# Patient Record
Sex: Male | Born: 1955 | Race: White | Hispanic: No | Smoking: Former smoker
Health system: Southern US, Community
[De-identification: ages and names within clinical notes are randomized; demographics above are authoritative.]

## PROBLEM LIST (undated history)

## (undated) DIAGNOSIS — J449 Chronic obstructive pulmonary disease, unspecified: Secondary | ICD-10-CM

## (undated) DIAGNOSIS — D696 Thrombocytopenia, unspecified: Secondary | ICD-10-CM

## (undated) DIAGNOSIS — K5190 Ulcerative colitis, unspecified, without complications: Secondary | ICD-10-CM

## (undated) DIAGNOSIS — M5136 Other intervertebral disc degeneration, lumbar region: Secondary | ICD-10-CM

## (undated) DIAGNOSIS — J45909 Unspecified asthma, uncomplicated: Secondary | ICD-10-CM

## (undated) DIAGNOSIS — E785 Hyperlipidemia, unspecified: Secondary | ICD-10-CM

## (undated) DIAGNOSIS — D649 Anemia, unspecified: Secondary | ICD-10-CM

## (undated) DIAGNOSIS — K589 Irritable bowel syndrome without diarrhea: Secondary | ICD-10-CM

## (undated) DIAGNOSIS — Z8739 Personal history of other diseases of the musculoskeletal system and connective tissue: Secondary | ICD-10-CM

## (undated) DIAGNOSIS — K7460 Unspecified cirrhosis of liver: Secondary | ICD-10-CM

## (undated) HISTORY — PX: HERNIA REPAIR: SHX51

## (undated) HISTORY — PX: COLONOSCOPY WITH ESOPHAGOGASTRODUODENOSCOPY (EGD): SHX5779

## (undated) HISTORY — PX: BACK SURGERY: SHX140

---

## 1999-12-04 ENCOUNTER — Encounter: Payer: Self-pay | Admitting: Orthopedic Surgery

## 1999-12-04 ENCOUNTER — Encounter: Payer: Self-pay | Admitting: Emergency Medicine

## 1999-12-04 ENCOUNTER — Emergency Department (HOSPITAL_COMMUNITY): Admission: EM | Admit: 1999-12-04 | Discharge: 1999-12-04 | Payer: Self-pay | Admitting: Emergency Medicine

## 2011-06-24 ENCOUNTER — Encounter: Payer: Self-pay | Admitting: Internal Medicine

## 2011-06-24 ENCOUNTER — Ambulatory Visit: Payer: Managed Care, Other (non HMO)

## 2011-06-24 ENCOUNTER — Ambulatory Visit (INDEPENDENT_AMBULATORY_CARE_PROVIDER_SITE_OTHER): Payer: Managed Care, Other (non HMO) | Admitting: Internal Medicine

## 2011-06-24 VITALS — BP 120/75 | HR 62 | Temp 98.9°F | Resp 14 | Ht 67.0 in | Wt 147.0 lb

## 2011-06-24 DIAGNOSIS — R9389 Abnormal findings on diagnostic imaging of other specified body structures: Secondary | ICD-10-CM

## 2011-06-24 DIAGNOSIS — R918 Other nonspecific abnormal finding of lung field: Secondary | ICD-10-CM

## 2011-06-24 DIAGNOSIS — R0602 Shortness of breath: Secondary | ICD-10-CM

## 2011-06-24 NOTE — Progress Notes (Signed)
  Subjective:    Patient ID: Clayton Robbins, male    DOB: Jun 09, 1955, 56 y.o.   MRN: [id number]  HPIPresents for followup chest x-ray due to an abnormality seen by Dr. Hal Hope 3 months ago. At that visit he was sent to GI for abdominal pain and eventually received the diagnosis of colitis. He is currently better with treatment. He is a former smoker having quit around 10 years ago. He has no chronic cough and has had no weight loss. He has no night sweats He does notice recent dyspnea on exertion over the past several months especially with walking up stairs. He works as a Veterinary surgeon but does not remember any industrial exposure to known toxins    Review of SystemsHistory of allergic rhinitis Shortness of breath gets worse with dust exposure Childhood history of asthma but no meds since then     Objective:   Physical Exam Thin male with normal vital signs No anterior cervical adenopathy The lungs are clear to auscultation though the breath sounds are quiet there no wheezes with forced expiration The heart has a regular rhythm without murmur rub or gallop      [facility] reading (PRIMARY) by  Dr.Audry Pecina:no nodules but ? Chronic changes of emphysema PFTs=severe obstruction Assessment & Plan:  Problem #1 abnormal chest x-ray-to be reviewed by radiology Problem #2 shortness of breath ?emphysema-consult pulmonary

## 2011-06-28 ENCOUNTER — Telehealth: Payer: Self-pay | Admitting: Internal Medicine

## 2011-06-30 NOTE — Telephone Encounter (Signed)
No note needed 

## 2011-07-10 ENCOUNTER — Institutional Professional Consult (permissible substitution): Payer: Self-pay | Admitting: Internal Medicine

## 2013-09-11 ENCOUNTER — Ambulatory Visit: Payer: Self-pay | Admitting: Surgery

## 2013-09-18 ENCOUNTER — Ambulatory Visit: Payer: Self-pay | Admitting: Surgery

## 2013-10-03 ENCOUNTER — Ambulatory Visit: Payer: Self-pay | Admitting: Gastroenterology

## 2013-12-29 ENCOUNTER — Ambulatory Visit: Payer: Self-pay | Admitting: Gastroenterology

## 2014-05-29 ENCOUNTER — Ambulatory Visit: Payer: Self-pay | Admitting: Gastroenterology

## 2014-08-22 NOTE — Op Note (Signed)
PATIENT NAME:  Clayton Robbins, Clayton Robbins MR#:  [id number] DATE OF BIRTH:  [birth year]  DATE OF PROCEDURE:  09/18/2013  PREOPERATIVE DIAGNOSIS: Right inguinal hernia.   POSTOPERATIVE DIAGNOSIS: Right inguinal hernia.   PROCEDURE: Right inguinal hernia repair.   SURGEON: Rochel Brome, M.D.   ANESTHESIA: General.   INDICATIONS: This 59 year old male has had bulging in the right groin and a hernia was demonstrated on physical exam and repair recommended for definitive treatment.   DESCRIPTION OF PROCEDURE: The patient was placed on the operating table in the supine position under general anesthesia. The right lower quadrant was clipped and prepared with ChloraPrep and draped in a sterile manner. A right lower quadrant transversely oriented suprapubic incision was made, carried down through subcutaneous tissues. Several small bleeding points were cauterized. Scarpa's fascia was incised. The external ring was identified. The external oblique aponeurosis was incised along the course of its fibers to open the external ring and expose the inguinal cord structures. The inguinal cord structures were mobilized and a Penrose drain passed around for traction. Cremaster fibers were spread examining the cord structures seeing no indirect component. There was however, a direct inguinal hernia, which the attenuated transversalis fascia was dissected and then incised circumferentially and removed. The sac itself was approximately 4 cm in length and was inverted. The repair was carried out with a row of 0 Surgilon sutures beginning at the pubic tubercle, suturing the conjoined tendon to the Cooper's ligament with transition stitch onto the shelving edge of the inguinal ligament incorporating transversalis fascia into the repair. The row of sutures was carried up to lead to satisfactory narrowing around the internal ring.   Next, an onlay Bard soft mesh was cut to create an oval shape of 2.8 x 3.5 cm with a notch cut out to  straddle the internal ring. This was placed over the repair. A medial relaxing incision was made so that the mesh would cover the relaxing incision. The mesh was sutured into place with 0 Surgilon. The repair looked good. Hemostasis was intact. The cut edges of the external oblique aponeurosis were closed with running 4-0 Vicryl. The deep fascia superior and lateral to the repair site was infiltrated with 0.5% Sensorcaine with epinephrine. Subcutaneous tissues were infiltrated using a total of 16 mL. Next, the Scarpa's fascia was closed with interrupted 4-0 Vicryl. The skin was closed with running 4-0 Monocryl subcuticular suture and Dermabond. The testicle remained in the scrotum. The patient tolerated surgery satisfactorily and was prepared for transfer to the recovery room.   ____________________________ Lenna Sciara. Rochel Brome, MD jws:aw D: 09/18/2013 08:38:49 ET T: 09/18/2013 08:54:19 ET JOB#: [id number]  cc: Loreli Dollar, MD, <Dictator> Loreli Dollar MD ELECTRONICALLY SIGNED 09/22/2013 9:11

## 2015-11-15 ENCOUNTER — Other Ambulatory Visit: Payer: Self-pay | Admitting: Internal Medicine

## 2015-11-15 DIAGNOSIS — K7460 Unspecified cirrhosis of liver: Secondary | ICD-10-CM

## 2015-12-03 ENCOUNTER — Ambulatory Visit
Admission: RE | Admit: 2015-12-03 | Discharge: 2015-12-03 | Disposition: A | Discharge status: Home / Self Care | Payer: 59 | Source: Ambulatory Visit | Attending: Internal Medicine | Admitting: Internal Medicine

## 2015-12-03 DIAGNOSIS — K7460 Unspecified cirrhosis of liver: Secondary | ICD-10-CM | POA: Diagnosis not present

## 2015-12-03 DIAGNOSIS — N281 Cyst of kidney, acquired: Secondary | ICD-10-CM | POA: Insufficient documentation

## 2015-12-03 DIAGNOSIS — R932 Abnormal findings on diagnostic imaging of liver and biliary tract: Secondary | ICD-10-CM | POA: Diagnosis not present

## 2016-04-06 ENCOUNTER — Encounter: Payer: Self-pay | Admitting: *Deleted

## 2016-04-07 ENCOUNTER — Encounter: Payer: Self-pay | Admitting: *Deleted

## 2016-04-07 ENCOUNTER — Ambulatory Visit: Payer: 59 | Admitting: Anesthesiology

## 2016-04-07 ENCOUNTER — Encounter
Admission: RE | Disposition: A | Discharge status: Home / Self Care | Payer: Self-pay | Source: Ambulatory Visit | Attending: Unknown Physician Specialty

## 2016-04-07 ENCOUNTER — Ambulatory Visit
Admission: RE | Admit: 2016-04-07 | Discharge: 2016-04-07 | Disposition: A | Discharge status: Home / Self Care | Payer: 59 | Source: Ambulatory Visit | Attending: Unknown Physician Specialty | Admitting: Unknown Physician Specialty

## 2016-04-07 DIAGNOSIS — K589 Irritable bowel syndrome without diarrhea: Secondary | ICD-10-CM | POA: Diagnosis not present

## 2016-04-07 DIAGNOSIS — D124 Benign neoplasm of descending colon: Secondary | ICD-10-CM | POA: Diagnosis not present

## 2016-04-07 DIAGNOSIS — K5730 Diverticulosis of large intestine without perforation or abscess without bleeding: Secondary | ICD-10-CM | POA: Diagnosis not present

## 2016-04-07 DIAGNOSIS — J449 Chronic obstructive pulmonary disease, unspecified: Secondary | ICD-10-CM | POA: Diagnosis not present

## 2016-04-07 DIAGNOSIS — Z86010 Personal history of colonic polyps: Secondary | ICD-10-CM | POA: Diagnosis not present

## 2016-04-07 DIAGNOSIS — K648 Other hemorrhoids: Secondary | ICD-10-CM | POA: Insufficient documentation

## 2016-04-07 DIAGNOSIS — Z79899 Other long term (current) drug therapy: Secondary | ICD-10-CM | POA: Diagnosis not present

## 2016-04-07 DIAGNOSIS — M5136 Other intervertebral disc degeneration, lumbar region: Secondary | ICD-10-CM | POA: Diagnosis not present

## 2016-04-07 DIAGNOSIS — E785 Hyperlipidemia, unspecified: Secondary | ICD-10-CM | POA: Diagnosis not present

## 2016-04-07 DIAGNOSIS — K5190 Ulcerative colitis, unspecified, without complications: Secondary | ICD-10-CM | POA: Diagnosis not present

## 2016-04-07 DIAGNOSIS — Z1211 Encounter for screening for malignant neoplasm of colon: Secondary | ICD-10-CM | POA: Insufficient documentation

## 2016-04-07 DIAGNOSIS — K7460 Unspecified cirrhosis of liver: Secondary | ICD-10-CM | POA: Diagnosis not present

## 2016-04-07 DIAGNOSIS — Z87891 Personal history of nicotine dependence: Secondary | ICD-10-CM | POA: Diagnosis not present

## 2016-04-07 DIAGNOSIS — D123 Benign neoplasm of transverse colon: Secondary | ICD-10-CM | POA: Diagnosis not present

## 2016-04-07 HISTORY — DX: Unspecified asthma, uncomplicated: J45.909

## 2016-04-07 HISTORY — DX: Other intervertebral disc degeneration, lumbar region: M51.36

## 2016-04-07 HISTORY — DX: Irritable bowel syndrome without diarrhea: K58.9

## 2016-04-07 HISTORY — DX: Ulcerative colitis, unspecified, without complications: K51.90

## 2016-04-07 HISTORY — DX: Unspecified cirrhosis of liver: K74.60

## 2016-04-07 HISTORY — PX: COLONOSCOPY WITH PROPOFOL: SHX5780

## 2016-04-07 HISTORY — DX: Personal history of other diseases of the musculoskeletal system and connective tissue: Z87.39

## 2016-04-07 HISTORY — DX: Anemia, unspecified: D64.9

## 2016-04-07 HISTORY — DX: Thrombocytopenia, unspecified: D69.6

## 2016-04-07 HISTORY — DX: Chronic obstructive pulmonary disease, unspecified: J44.9

## 2016-04-07 HISTORY — DX: Hyperlipidemia, unspecified: E78.5

## 2016-04-07 SURGERY — COLONOSCOPY WITH PROPOFOL
Anesthesia: General

## 2016-04-07 MED ORDER — FENTANYL CITRATE (PF) 100 MCG/2ML IJ SOLN
INTRAMUSCULAR | Status: DC | PRN
  Administered 2016-04-07: 50 ug via INTRAVENOUS

## 2016-04-07 MED ORDER — SODIUM CHLORIDE 0.9 % IV SOLN
INTRAVENOUS | Status: DC
  Administered 2016-04-07: 13:00:00 via INTRAVENOUS

## 2016-04-07 MED ORDER — SODIUM CHLORIDE 0.9 % IV SOLN: INTRAVENOUS | Status: DC

## 2016-04-07 MED ORDER — PROPOFOL 500 MG/50ML IV EMUL
INTRAVENOUS | Status: DC | PRN
  Administered 2016-04-07: 120 ug/kg/min via INTRAVENOUS

## 2016-04-07 MED ORDER — MIDAZOLAM HCL 2 MG/2ML IJ SOLN
INTRAMUSCULAR | Status: DC | PRN
  Administered 2016-04-07: 1 mg via INTRAVENOUS

## 2016-04-07 NOTE — Transfer of Care (Signed)
Immediate Anesthesia Transfer of Care Note  Patient: Clayton Robbins  Procedure(s) Performed: Procedure(s): COLONOSCOPY WITH PROPOFOL (N/A)  Patient Location: PACU  Anesthesia Type:General  Level of Consciousness: awake, alert , oriented and sedated  Airway & Oxygen Therapy: Patient Spontanous Breathing and Patient connected to nasal cannula oxygen  Post-op Assessment: Report given to RN and Post -op Vital signs reviewed and stable  Post vital signs: Reviewed and stable  Last Vitals:  Vitals:   04/07/16 1100  BP: (!) 154/97  Pulse: 72  Resp: 18  Temp: (!) 35.3 C    Last Pain:  Vitals:   04/07/16 1100  TempSrc: Tympanic         Complications: No apparent anesthesia complications

## 2016-04-07 NOTE — H&P (Signed)
Primary Care Physician:  Idelle Crouch, MD Primary Gastroenterologist:  Dr. Vira Agar  Pre-Procedure History & Physical: HPI:  Clayton Robbins is a 60 y.o. male is here for an colonoscopy.   Past Medical History:  Diagnosis Date  . Anemia   . Asthma   . Cirrhosis ([place])   . COPD (chronic obstructive pulmonary disease) ([place])   . DDD (degenerative disc disease), lumbar   . History of rhabdomyolysis   . Hyperlipidemia   . IBS (irritable bowel syndrome)   . Thrombocytopenia ([place])   . Ulcerative colitis [facility])     Past Surgical History:  Procedure Laterality Date  . COLONOSCOPY WITH ESOPHAGOGASTRODUODENOSCOPY (EGD)    . HERNIA REPAIR      Prior to Admission medications   Medication Sig Start Date End Date Taking? Authorizing Provider  albuterol (PROVENTIL HFA;VENTOLIN HFA) 108 (90 Base) MCG/ACT inhaler Inhale 2 puffs into the lungs every 4 (four) hours as needed for wheezing or shortness of breath.   Yes Historical Provider, MD  B Complex-C (B-COMPLEX WITH VITAMIN C) tablet Take 1 tablet by mouth daily.   Yes Historical Provider, MD  clonazePAM (KLONOPIN) 0.5 MG tablet Take 0.5 mg by mouth 2 (two) times daily as needed for anxiety.   Yes Historical Provider, MD  folic acid (FOLVITE) 1 MG tablet Take 1 mg by mouth daily.   Yes Historical Provider, MD  mesalamine (LIALDA) 1.2 G EC tablet Take 1,200 mg by mouth daily with breakfast.   Yes Historical Provider, MD  methocarbamol (ROBAXIN) 750 MG tablet Take 750 mg by mouth 4 (four) times daily.   Yes Historical Provider, MD  Multiple Vitamin (MULTIVITAMIN) tablet Take 1 tablet by mouth daily.   Yes Historical Provider, MD  umeclidinium-vilanterol (ANORO ELLIPTA) 62.5-25 MCG/INH AEPB Inhale 1 puff into the lungs daily.   Yes Historical Provider, MD  HYDROcodone-acetaminophen (NORCO/VICODIN) 5-325 MG tablet Take 1 tablet by mouth every 6 (six) hours as needed for moderate pain.    Historical Provider, MD  mesalamine (CANASA) 1000 MG  suppository Place 1,000 mg rectally at bedtime.    Historical Provider, MD    Allergies as of 03/17/2016  . (No Known Allergies)    History reviewed. No pertinent family history.  Social History   Social History  . Marital status: Divorced    Spouse name: N/A  . Number of children: N/A  . Years of education: N/A   Occupational History  . Not on file.   Social History Main Topics  . Smoking status: Former Smoker    Packs/day: 1.50    Years: 20.00    Quit date: 06/23/1996  . Smokeless tobacco: Current User  . Alcohol use 5.4 oz/week    6 Cans of beer, 3 Shots of liquor per week  . Drug use: No  . Sexual activity: Not on file   Other Topics Concern  . Not on file   Social History Narrative  . No narrative on file    Review of Systems: See HPI, otherwise negative ROS  Physical Exam: BP (!) 154/97   Pulse 72   Temp (!) 95.6 F (35.3 C) (Tympanic)   Resp 18  General:   Alert,  pleasant and cooperative in NAD Head:  Normocephalic and atraumatic. Neck:  Supple; no masses or thyromegaly. Lungs:  Clear throughout to auscultation.    Heart:  Regular rate and rhythm. Abdomen:  Soft, nontender and nondistended. Normal bowel sounds, without guarding, and without rebound.   Neurologic:  Alert  and  oriented x4;  grossly normal neurologically.  Impression/Plan: Clayton Robbins is here for an colonoscopy to be performed for [facility] colon polyps, left sided ulcerative colitis on Lialda 4.8g per day.  Risks, benefits, limitations, and alternatives regarding  colonoscopy have been reviewed with the patient.  Questions have been answered.  All parties agreeable.   Gaylyn Cheers, MD  04/07/2016, 1:21 PM

## 2016-04-07 NOTE — Anesthesia Postprocedure Evaluation (Signed)
Anesthesia Post Note  Patient: Clayton Robbins  Procedure(s) Performed: Procedure(s) (LRB): COLONOSCOPY WITH PROPOFOL (N/A)  Patient location during evaluation: Endoscopy Anesthesia Type: General Level of consciousness: awake and alert and oriented Pain management: pain level controlled Vital Signs Assessment: post-procedure vital signs reviewed and stable Respiratory status: spontaneous breathing, nonlabored ventilation and respiratory function stable Cardiovascular status: blood pressure returned to baseline and stable Postop Assessment: no signs of nausea or vomiting Anesthetic complications: no    Last Vitals:  Vitals:   04/07/16 1402 04/07/16 1412  BP: 133/86 (!) 144/95  Pulse: 78 83  Resp:  14  Temp: 36.6 C     Last Pain:  Vitals:   04/07/16 1402  TempSrc: Tympanic                 Eivin Mascio

## 2016-04-07 NOTE — Anesthesia Procedure Notes (Signed)
Performed by: COOK-MARTIN, Abena Erdman Pre-anesthesia Checklist: Patient identified, Emergency Drugs available, Suction available, Patient being monitored and Timeout performed Patient Re-evaluated:Patient Re-evaluated prior to inductionOxygen Delivery Method: Nasal cannula Preoxygenation: Pre-oxygenation with 100% oxygen Intubation Type: IV induction Placement Confirmation: positive ETCO2 and CO2 detector       

## 2016-04-07 NOTE — Anesthesia Preprocedure Evaluation (Signed)
Anesthesia Evaluation  Patient identified by MRN, date of birth, ID band Patient awake    Reviewed: Allergy & Precautions, NPO status , Patient's Chart, lab work & pertinent test results  History of Anesthesia Complications Negative for: history of anesthetic complications  Airway Mallampati: II  TM Distance: >3 FB Neck ROM: Full    Dental  (+) Poor Dentition   Pulmonary neg sleep apnea, COPD,  COPD inhaler, former smoker,    breath sounds clear to auscultation- rhonchi (-) wheezing      Cardiovascular Exercise Tolerance: Good (-) hypertension(-) CAD and (-) Past MI  Rhythm:Regular Rate:Normal - Systolic murmurs and - Diastolic murmurs    Neuro/Psych negative neurological ROS  negative psych ROS   GI/Hepatic PUD, Fatty liver    Endo/Other  negative endocrine ROSneg diabetes  Renal/GU negative Renal ROS     Musculoskeletal  (+) Arthritis ,   Abdominal (+) - obese,   Peds  Hematology  (+) anemia ,   Anesthesia Other Findings Past Medical History: No date: Anemia No date: Asthma No date: Cirrhosis ([facility]) No date: COPD (chronic obstructive pulmonary disease) (* No date: DDD (degenerative disc disease), lumbar No date: History of rhabdomyolysis No date: Hyperlipidemia No date: IBS (irritable bowel syndrome) No date: Thrombocytopenia ([place]) No date: Ulcerative colitis ([place])   Reproductive/Obstetrics                            Anesthesia Physical Anesthesia Plan  ASA: III  Anesthesia Plan: General   Post-op Pain Management:    Induction: Intravenous  Airway Management Planned: Natural Airway  Additional Equipment:   Intra-op Plan:   Post-operative Plan:   Informed Consent: I have reviewed the patients History and Physical, chart, labs and discussed the procedure including the risks, benefits and alternatives for the proposed anesthesia with the patient or authorized  representative who has indicated his/her understanding and acceptance.   Dental advisory given  Plan Discussed with: CRNA and Anesthesiologist  Anesthesia Plan Comments:         Anesthesia Quick Evaluation

## 2016-04-07 NOTE — Op Note (Signed)
[facility] Gastroenterology Patient Name: Clayton Robbins Procedure Date: 04/07/2016 1:31 PM MRN: [id number] Account #: [id number] Date of Birth: Jan 26, 1956 Admit Type: Outpatient Age: 60 Room: [facility] ENDO ROOM 4 Gender: Male Note Status: Finalized Procedure:            Colonoscopy Indications:          High risk colon cancer surveillance: Personal history                        of colonic polyps Providers:            Manya Silvas, MD Referring MD:         Leonie Douglas. Doy Hutching, MD (Referring MD) Medicines:            Propofol per Anesthesia Complications:        No immediate complications. Procedure:            Pre-Anesthesia Assessment:                       - After reviewing the risks and benefits, the patient                        was deemed in satisfactory condition to undergo the                        procedure.                       After obtaining informed consent, the colonoscope was                        passed under direct vision. Throughout the procedure,                        the patient's blood pressure, pulse, and oxygen                        saturations were monitored continuously. The                        Colonoscope was introduced through the anus and                        advanced to the the cecum, identified by appendiceal                        orifice and ileocecal valve. The colonoscopy was                        performed without difficulty. The patient tolerated the                        procedure well. The quality of the bowel preparation                        was good. Findings:      The sigmoid colon looked very good with no obvious inflammation present,       vascular markings normal in some areas.      A diminutive polyp was found in the transverse colon. The polyp was       sessile. The  polyp was removed with a jumbo cold forceps. Resection and       retrieval were complete.      A diminutive polyp was found in the  descending colon. The polyp was       sessile. The polyp was removed with a jumbo cold forceps. Resection and       retrieval were complete.      Multiple small-mouthed diverticula were found in the sigmoid colon and       descending colon.      Internal hemorrhoids were found during endoscopy. The hemorrhoids were       small and Grade I (internal hemorrhoids that do not prolapse).      The exam was otherwise without abnormality. Impression:           - One diminutive polyp in the transverse colon, removed                        with a jumbo cold forceps. Resected and retrieved.                       - One diminutive polyp in the descending colon, removed                        with a jumbo cold forceps. Resected and retrieved.                       - Diverticulosis in the sigmoid colon and in the                        descending colon.                       - Internal hemorrhoids.                       - The examination was otherwise normal. Recommendation:       - Await pathology results. Stay on medications Manya Silvas, MD 04/07/2016 2:01:46 PM This report has been signed electronically. Number of Addenda: 0 Note Initiated On: 04/07/2016 1:31 PM Scope Withdrawal Time: 0 hours 10 minutes 5 seconds  Total Procedure Duration: 0 hours 20 minutes 24 seconds       [facility]

## 2016-04-10 ENCOUNTER — Encounter: Payer: Self-pay | Admitting: Unknown Physician Specialty

## 2016-04-10 LAB — SURGICAL PATHOLOGY

## 2016-05-26 DIAGNOSIS — K5150 Left sided colitis without complications: Secondary | ICD-10-CM | POA: Diagnosis not present

## 2016-05-26 DIAGNOSIS — E782 Mixed hyperlipidemia: Secondary | ICD-10-CM | POA: Diagnosis not present

## 2016-05-26 DIAGNOSIS — Z79899 Other long term (current) drug therapy: Secondary | ICD-10-CM | POA: Diagnosis not present

## 2016-05-26 DIAGNOSIS — J439 Emphysema, unspecified: Secondary | ICD-10-CM | POA: Diagnosis not present

## 2016-06-09 DIAGNOSIS — R0609 Other forms of dyspnea: Secondary | ICD-10-CM | POA: Diagnosis not present

## 2016-09-08 DIAGNOSIS — R0602 Shortness of breath: Secondary | ICD-10-CM | POA: Diagnosis not present

## 2016-09-08 DIAGNOSIS — R05 Cough: Secondary | ICD-10-CM | POA: Diagnosis not present

## 2016-09-08 DIAGNOSIS — J439 Emphysema, unspecified: Secondary | ICD-10-CM | POA: Diagnosis not present

## 2016-10-15 DIAGNOSIS — M545 Low back pain: Secondary | ICD-10-CM | POA: Diagnosis not present

## 2016-11-27 DIAGNOSIS — J310 Chronic rhinitis: Secondary | ICD-10-CM | POA: Diagnosis not present

## 2016-11-27 DIAGNOSIS — J449 Chronic obstructive pulmonary disease, unspecified: Secondary | ICD-10-CM | POA: Diagnosis not present

## 2016-11-27 DIAGNOSIS — R0609 Other forms of dyspnea: Secondary | ICD-10-CM | POA: Diagnosis not present

## 2016-12-13 DIAGNOSIS — R0609 Other forms of dyspnea: Secondary | ICD-10-CM | POA: Diagnosis not present

## 2016-12-13 DIAGNOSIS — J439 Emphysema, unspecified: Secondary | ICD-10-CM | POA: Diagnosis not present

## 2016-12-15 ENCOUNTER — Other Ambulatory Visit: Payer: Self-pay | Admitting: Internal Medicine

## 2016-12-15 DIAGNOSIS — Z0000 Encounter for general adult medical examination without abnormal findings: Secondary | ICD-10-CM | POA: Diagnosis not present

## 2016-12-15 DIAGNOSIS — J439 Emphysema, unspecified: Secondary | ICD-10-CM | POA: Diagnosis not present

## 2016-12-15 DIAGNOSIS — R0600 Dyspnea, unspecified: Secondary | ICD-10-CM

## 2016-12-15 DIAGNOSIS — R0609 Other forms of dyspnea: Principal | ICD-10-CM

## 2016-12-20 DIAGNOSIS — Z79899 Other long term (current) drug therapy: Secondary | ICD-10-CM | POA: Diagnosis not present

## 2016-12-22 ENCOUNTER — Ambulatory Visit
Admission: RE | Admit: 2016-12-22 | Discharge: 2016-12-22 | Disposition: A | Discharge status: Home / Self Care | Payer: 59 | Source: Ambulatory Visit | Attending: Internal Medicine | Admitting: Internal Medicine

## 2016-12-22 DIAGNOSIS — R0609 Other forms of dyspnea: Secondary | ICD-10-CM | POA: Diagnosis present

## 2016-12-22 DIAGNOSIS — I700 Atherosclerosis of aorta: Secondary | ICD-10-CM | POA: Diagnosis not present

## 2016-12-22 DIAGNOSIS — R0600 Dyspnea, unspecified: Secondary | ICD-10-CM | POA: Diagnosis not present

## 2016-12-22 DIAGNOSIS — J439 Emphysema, unspecified: Secondary | ICD-10-CM | POA: Insufficient documentation

## 2016-12-22 MED ORDER — IOPAMIDOL (ISOVUE-370) INJECTION 76%
75.0000 mL | Freq: Once | INTRAVENOUS | Status: AC | PRN
  Administered 2016-12-22: 75 mL via INTRAVENOUS

## 2017-02-09 DIAGNOSIS — J449 Chronic obstructive pulmonary disease, unspecified: Secondary | ICD-10-CM | POA: Diagnosis not present

## 2017-02-09 DIAGNOSIS — R0609 Other forms of dyspnea: Secondary | ICD-10-CM | POA: Diagnosis not present

## 2017-03-20 DIAGNOSIS — J439 Emphysema, unspecified: Secondary | ICD-10-CM | POA: Diagnosis not present

## 2017-03-30 DIAGNOSIS — E781 Pure hyperglyceridemia: Secondary | ICD-10-CM | POA: Diagnosis not present

## 2017-03-30 DIAGNOSIS — E039 Hypothyroidism, unspecified: Secondary | ICD-10-CM | POA: Diagnosis not present

## 2017-06-18 DIAGNOSIS — E039 Hypothyroidism, unspecified: Secondary | ICD-10-CM | POA: Diagnosis not present

## 2017-06-18 DIAGNOSIS — J439 Emphysema, unspecified: Secondary | ICD-10-CM | POA: Diagnosis not present

## 2017-06-18 DIAGNOSIS — D696 Thrombocytopenia, unspecified: Secondary | ICD-10-CM | POA: Diagnosis not present

## 2017-06-26 DIAGNOSIS — R0609 Other forms of dyspnea: Secondary | ICD-10-CM | POA: Diagnosis not present

## 2017-06-26 DIAGNOSIS — J439 Emphysema, unspecified: Secondary | ICD-10-CM | POA: Diagnosis not present

## 2017-07-03 DIAGNOSIS — Z79899 Other long term (current) drug therapy: Secondary | ICD-10-CM | POA: Diagnosis not present

## 2017-07-03 DIAGNOSIS — E782 Mixed hyperlipidemia: Secondary | ICD-10-CM | POA: Diagnosis not present

## 2017-10-03 DIAGNOSIS — Z79899 Other long term (current) drug therapy: Secondary | ICD-10-CM | POA: Diagnosis not present

## 2017-10-03 DIAGNOSIS — E782 Mixed hyperlipidemia: Secondary | ICD-10-CM | POA: Diagnosis not present

## 2017-12-25 IMAGING — CT CT ANGIO CHEST
2 of 8 series · 17 of 32 positions shown · IV contrast (isovue)
Comparison: Chest x-ray dated 06/24/2011 and report of chest x-ray
dated 09/08/2016

CLINICAL DATA: Dyspnea on exertion.

EXAM:
CT ANGIOGRAPHY CHEST WITH CONTRAST
TECHNIQUE: Multidetector CT imaging of the chest was performed using the
standard protocol during bolus administration of intravenous
contrast. Multiplanar CT image reconstructions and MIPs were
obtained to evaluate the vascular anatomy.
CONTRAST:  150 cc Isovue 370 in two boluses

[Series 10: thins · axial · 0.74mm/px · z∈[-670,-412]mm · 9 of 324 slices shown (1 of 2)]
[im 33/324  lung]
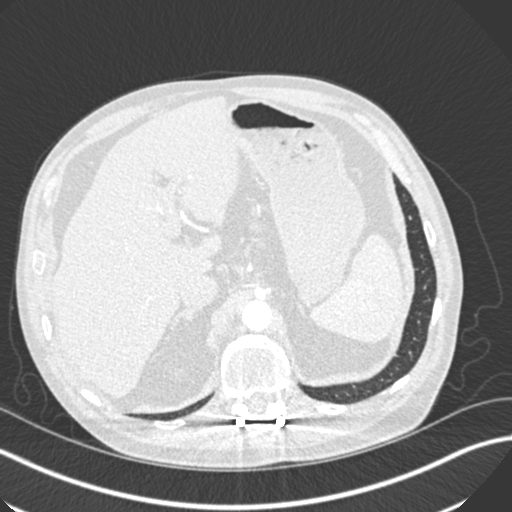
[im 65/324  soft-tissue]
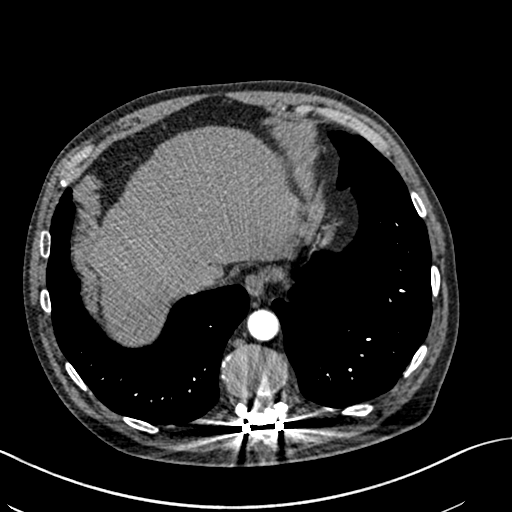
[im 97/324  lung]
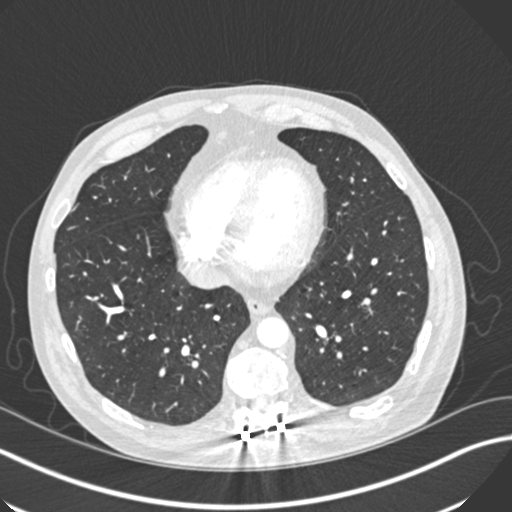
[im 130/324  soft-tissue]
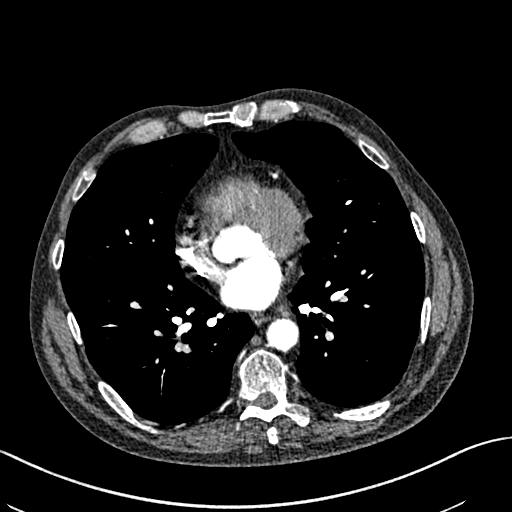
[im 162/324  lung]
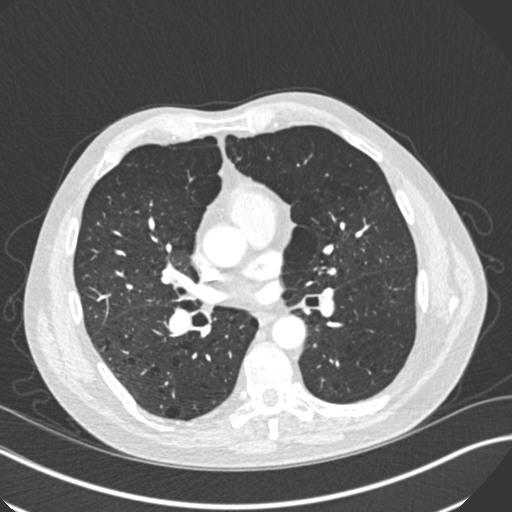
[im 194/324  soft-tissue]
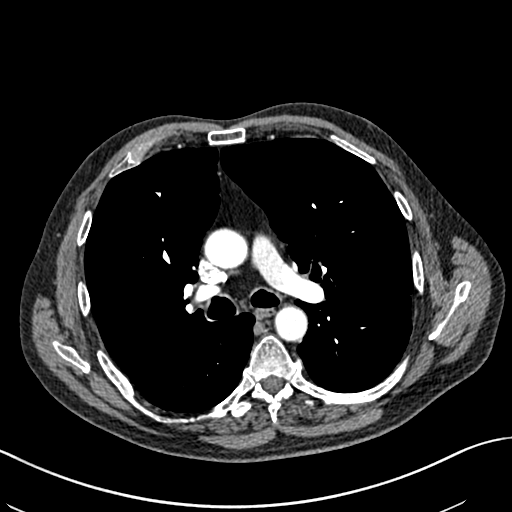
[im 227/324  lung]
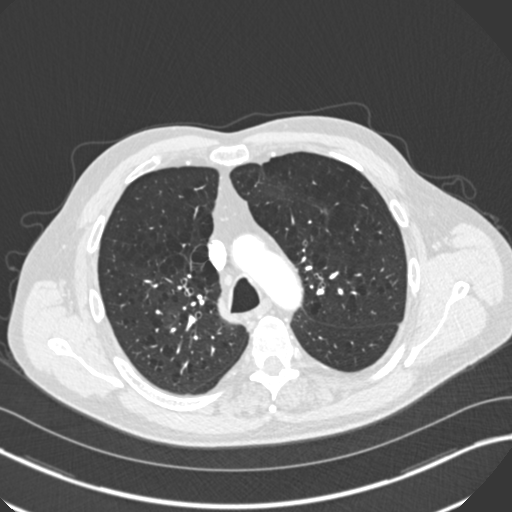
[im 259/324  soft-tissue]
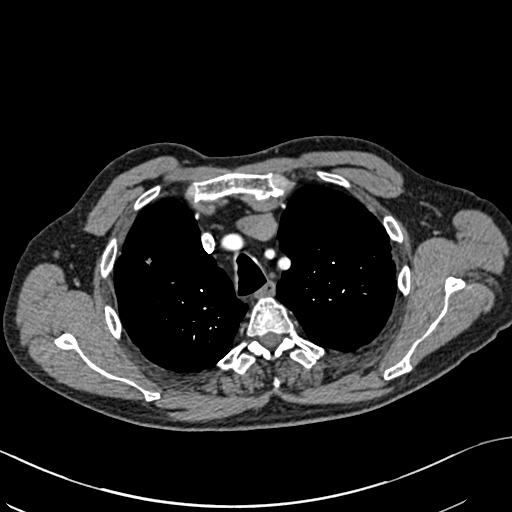
[im 291/324  lung]
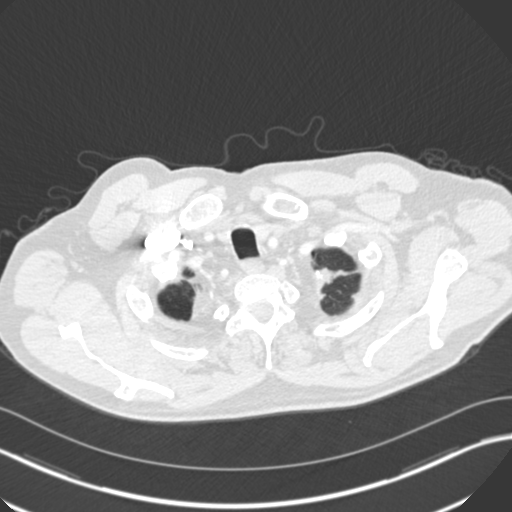

[Series 20: thins · axial · 0.75mm/px · z∈[-670,-412]mm · 8 of 324 slices shown (2 of 2)]
[im 33/324  lung]
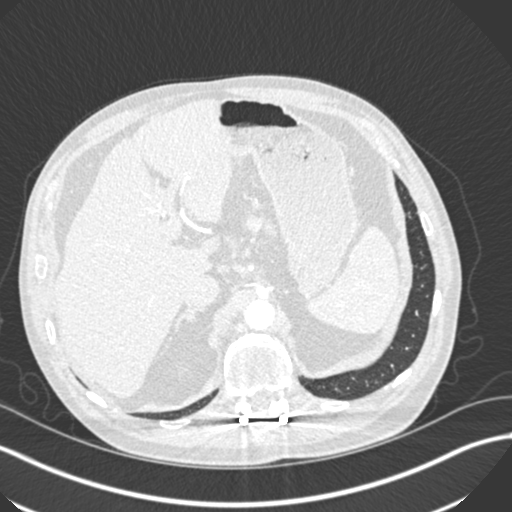
[im 65/324  lung]
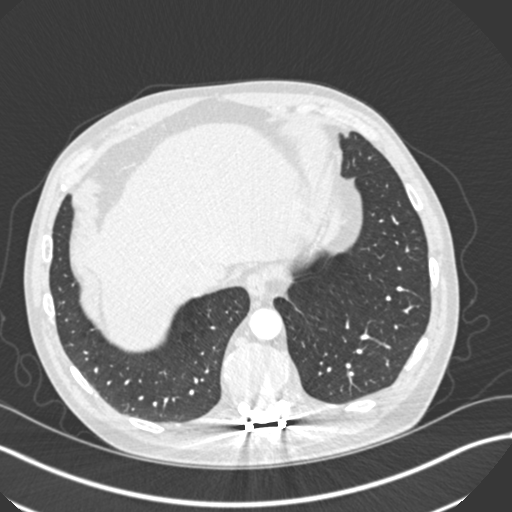
[im 97/324  lung]
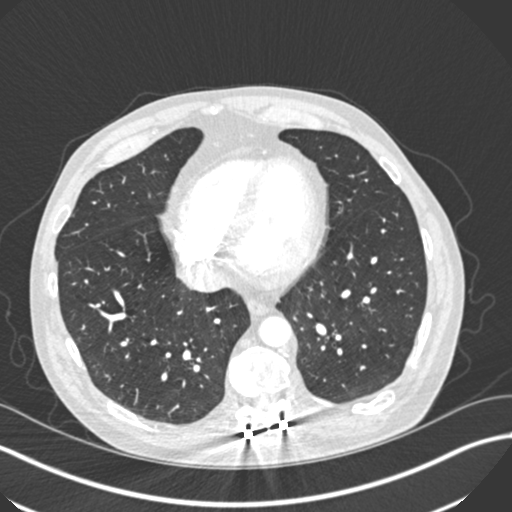
[im 130/324  lung]
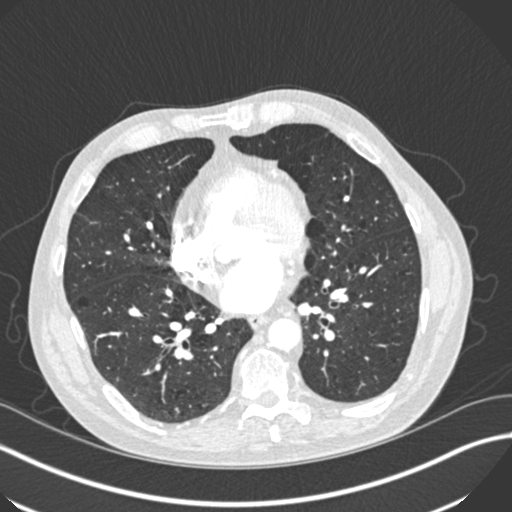
[im 194/324  lung]
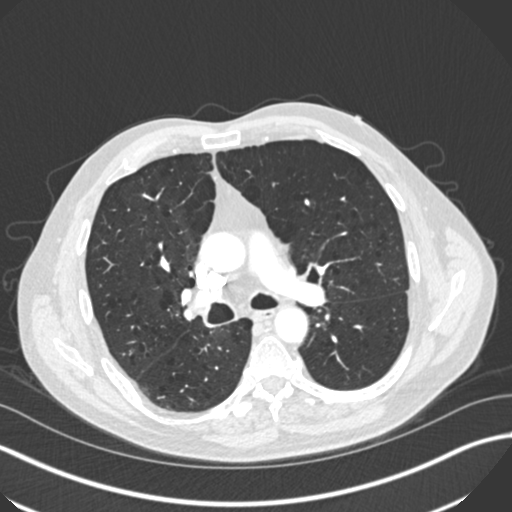
[im 227/324  lung]
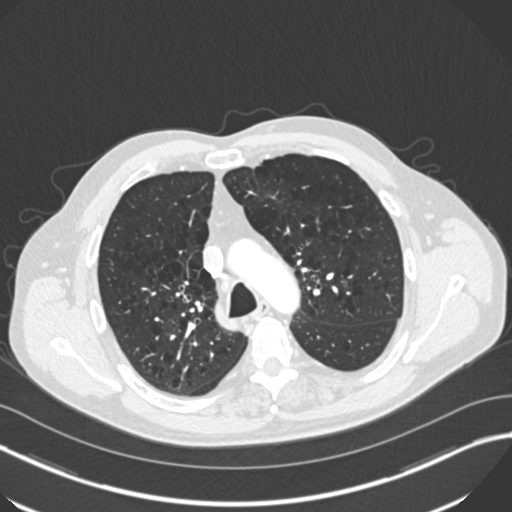
[im 259/324  lung]
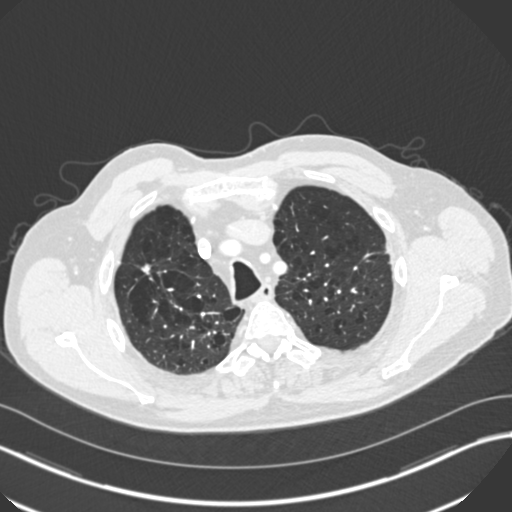
[im 291/324  lung]
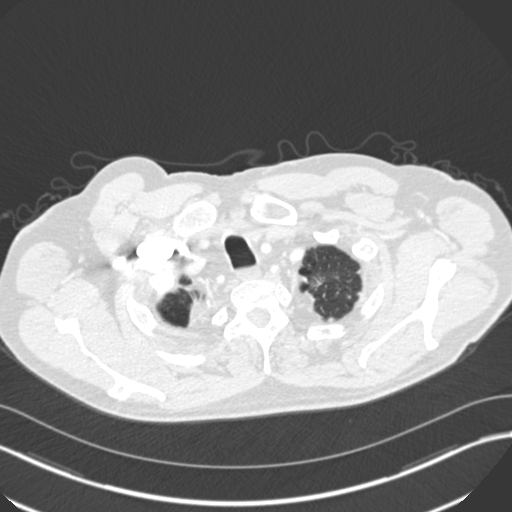

[17 of 32 positions shown; findings below may reference images not displayed]

FINDINGS: Cardiovascular: Satisfactory opacification of the pulmonary arteries
to the segmental level. No evidence of pulmonary embolism. Normal
heart size. No pericardial effusion. Slight aortic atherosclerosis.

Mediastinum/Nodes: No enlarged mediastinal, hilar, or axillary lymph
nodes. Thyroid gland, trachea, and esophagus demonstrate no
significant findings.

Lungs/Pleura: No infiltrates or effusions. Extensive emphysematous
changes particularly in the upper lobes were there is also apical
scarring bilaterally.

Upper Abdomen: Negative.

Musculoskeletal: No acute abnormality. Old severe compression
fracture of L1. Posterior fusion at that level.

Review of the MIP images confirms the above findings.
IMPRESSION: 1. No pulmonary emboli or other acute abnormality.
2. Severe emphysema.
3. Aortic atherosclerosis.

## 2018-01-04 DIAGNOSIS — Z79899 Other long term (current) drug therapy: Secondary | ICD-10-CM | POA: Diagnosis not present

## 2018-01-04 DIAGNOSIS — J439 Emphysema, unspecified: Secondary | ICD-10-CM | POA: Diagnosis not present

## 2018-01-04 DIAGNOSIS — Z0000 Encounter for general adult medical examination without abnormal findings: Secondary | ICD-10-CM | POA: Diagnosis not present

## 2018-01-08 DIAGNOSIS — R0609 Other forms of dyspnea: Secondary | ICD-10-CM | POA: Diagnosis not present

## 2018-01-08 DIAGNOSIS — R490 Dysphonia: Secondary | ICD-10-CM | POA: Diagnosis not present

## 2018-01-08 DIAGNOSIS — J449 Chronic obstructive pulmonary disease, unspecified: Secondary | ICD-10-CM | POA: Diagnosis not present

## 2018-05-14 DIAGNOSIS — R0609 Other forms of dyspnea: Secondary | ICD-10-CM | POA: Diagnosis not present

## 2018-05-14 DIAGNOSIS — J439 Emphysema, unspecified: Secondary | ICD-10-CM | POA: Diagnosis not present

## 2018-07-05 DIAGNOSIS — J449 Chronic obstructive pulmonary disease, unspecified: Secondary | ICD-10-CM | POA: Diagnosis not present

## 2018-07-05 DIAGNOSIS — E782 Mixed hyperlipidemia: Secondary | ICD-10-CM | POA: Diagnosis not present

## 2018-07-05 DIAGNOSIS — J439 Emphysema, unspecified: Secondary | ICD-10-CM | POA: Diagnosis not present

## 2018-07-05 DIAGNOSIS — E039 Hypothyroidism, unspecified: Secondary | ICD-10-CM | POA: Diagnosis not present

## 2018-07-05 DIAGNOSIS — Z79899 Other long term (current) drug therapy: Secondary | ICD-10-CM | POA: Diagnosis not present

## 2018-09-19 DIAGNOSIS — R0600 Dyspnea, unspecified: Secondary | ICD-10-CM | POA: Diagnosis not present

## 2018-09-19 DIAGNOSIS — J449 Chronic obstructive pulmonary disease, unspecified: Secondary | ICD-10-CM | POA: Diagnosis not present

## 2019-07-24 DIAGNOSIS — Z20822 Contact with and (suspected) exposure to covid-19: Secondary | ICD-10-CM | POA: Diagnosis not present

## 2019-07-24 DIAGNOSIS — R0602 Shortness of breath: Secondary | ICD-10-CM | POA: Diagnosis not present

## 2019-07-24 DIAGNOSIS — J439 Emphysema, unspecified: Secondary | ICD-10-CM | POA: Diagnosis not present

## 2019-07-24 DIAGNOSIS — Z01818 Encounter for other preprocedural examination: Secondary | ICD-10-CM | POA: Diagnosis not present

## 2019-07-24 DIAGNOSIS — R0600 Dyspnea, unspecified: Secondary | ICD-10-CM | POA: Diagnosis not present

## 2019-08-20 DIAGNOSIS — Z79899 Other long term (current) drug therapy: Secondary | ICD-10-CM | POA: Diagnosis not present

## 2019-08-20 DIAGNOSIS — E782 Mixed hyperlipidemia: Secondary | ICD-10-CM | POA: Diagnosis not present

## 2019-08-20 DIAGNOSIS — K7030 Alcoholic cirrhosis of liver without ascites: Secondary | ICD-10-CM | POA: Diagnosis not present

## 2019-08-20 DIAGNOSIS — E039 Hypothyroidism, unspecified: Secondary | ICD-10-CM | POA: Diagnosis not present

## 2019-08-20 DIAGNOSIS — J439 Emphysema, unspecified: Secondary | ICD-10-CM | POA: Diagnosis not present

## 2019-08-20 DIAGNOSIS — D696 Thrombocytopenia, unspecified: Secondary | ICD-10-CM | POA: Diagnosis not present

## 2019-08-20 DIAGNOSIS — Z0000 Encounter for general adult medical examination without abnormal findings: Secondary | ICD-10-CM | POA: Diagnosis not present

## 2019-08-20 DIAGNOSIS — G894 Chronic pain syndrome: Secondary | ICD-10-CM | POA: Diagnosis not present

## 2020-01-15 DIAGNOSIS — R0600 Dyspnea, unspecified: Secondary | ICD-10-CM | POA: Diagnosis not present

## 2020-01-15 DIAGNOSIS — J439 Emphysema, unspecified: Secondary | ICD-10-CM | POA: Diagnosis not present

## 2020-01-15 DIAGNOSIS — Z23 Encounter for immunization: Secondary | ICD-10-CM | POA: Diagnosis not present

## 2020-01-15 DIAGNOSIS — Z09 Encounter for follow-up examination after completed treatment for conditions other than malignant neoplasm: Secondary | ICD-10-CM | POA: Diagnosis not present

## 2020-03-12 DIAGNOSIS — R7309 Other abnormal glucose: Secondary | ICD-10-CM | POA: Diagnosis not present

## 2020-03-12 DIAGNOSIS — J439 Emphysema, unspecified: Secondary | ICD-10-CM | POA: Diagnosis not present

## 2020-03-12 DIAGNOSIS — Z125 Encounter for screening for malignant neoplasm of prostate: Secondary | ICD-10-CM | POA: Diagnosis not present

## 2020-03-12 DIAGNOSIS — G894 Chronic pain syndrome: Secondary | ICD-10-CM | POA: Diagnosis not present

## 2020-03-12 DIAGNOSIS — Z0000 Encounter for general adult medical examination without abnormal findings: Secondary | ICD-10-CM | POA: Diagnosis not present

## 2020-03-12 DIAGNOSIS — Z79899 Other long term (current) drug therapy: Secondary | ICD-10-CM | POA: Diagnosis not present

## 2020-03-12 DIAGNOSIS — D696 Thrombocytopenia, unspecified: Secondary | ICD-10-CM | POA: Diagnosis not present

## 2020-03-12 DIAGNOSIS — K7030 Alcoholic cirrhosis of liver without ascites: Secondary | ICD-10-CM | POA: Diagnosis not present

## 2020-03-12 DIAGNOSIS — E039 Hypothyroidism, unspecified: Secondary | ICD-10-CM | POA: Diagnosis not present

## 2020-03-12 DIAGNOSIS — K5150 Left sided colitis without complications: Secondary | ICD-10-CM | POA: Diagnosis not present

## 2020-03-12 DIAGNOSIS — E782 Mixed hyperlipidemia: Secondary | ICD-10-CM | POA: Diagnosis not present

## 2020-09-10 DIAGNOSIS — E039 Hypothyroidism, unspecified: Secondary | ICD-10-CM | POA: Diagnosis not present

## 2020-09-10 DIAGNOSIS — Z79899 Other long term (current) drug therapy: Secondary | ICD-10-CM | POA: Diagnosis not present

## 2020-09-10 DIAGNOSIS — Z0000 Encounter for general adult medical examination without abnormal findings: Secondary | ICD-10-CM | POA: Diagnosis not present

## 2020-09-10 DIAGNOSIS — I10 Essential (primary) hypertension: Secondary | ICD-10-CM | POA: Diagnosis not present

## 2020-09-10 DIAGNOSIS — G894 Chronic pain syndrome: Secondary | ICD-10-CM | POA: Diagnosis not present

## 2020-09-10 DIAGNOSIS — J431 Panlobular emphysema: Secondary | ICD-10-CM | POA: Diagnosis not present

## 2020-09-10 DIAGNOSIS — R7309 Other abnormal glucose: Secondary | ICD-10-CM | POA: Diagnosis not present

## 2020-09-10 DIAGNOSIS — J449 Chronic obstructive pulmonary disease, unspecified: Secondary | ICD-10-CM | POA: Diagnosis not present

## 2020-09-10 DIAGNOSIS — E782 Mixed hyperlipidemia: Secondary | ICD-10-CM | POA: Diagnosis not present

## 2020-11-02 DIAGNOSIS — D696 Thrombocytopenia, unspecified: Secondary | ICD-10-CM | POA: Diagnosis not present

## 2020-11-02 DIAGNOSIS — E782 Mixed hyperlipidemia: Secondary | ICD-10-CM | POA: Diagnosis not present

## 2020-11-02 DIAGNOSIS — G894 Chronic pain syndrome: Secondary | ICD-10-CM | POA: Diagnosis not present

## 2020-11-02 DIAGNOSIS — J431 Panlobular emphysema: Secondary | ICD-10-CM | POA: Diagnosis not present

## 2020-11-02 DIAGNOSIS — I10 Essential (primary) hypertension: Secondary | ICD-10-CM | POA: Diagnosis not present

## 2020-11-02 DIAGNOSIS — K5150 Left sided colitis without complications: Secondary | ICD-10-CM | POA: Diagnosis not present

## 2020-11-02 DIAGNOSIS — E039 Hypothyroidism, unspecified: Secondary | ICD-10-CM | POA: Diagnosis not present

## 2021-01-04 DIAGNOSIS — R0609 Other forms of dyspnea: Secondary | ICD-10-CM | POA: Diagnosis not present

## 2021-01-04 DIAGNOSIS — F1021 Alcohol dependence, in remission: Secondary | ICD-10-CM | POA: Diagnosis not present

## 2021-01-04 DIAGNOSIS — K7030 Alcoholic cirrhosis of liver without ascites: Secondary | ICD-10-CM | POA: Diagnosis not present

## 2021-01-04 DIAGNOSIS — J431 Panlobular emphysema: Secondary | ICD-10-CM | POA: Diagnosis not present

## 2021-01-04 DIAGNOSIS — K766 Portal hypertension: Secondary | ICD-10-CM | POA: Diagnosis not present

## 2021-01-04 DIAGNOSIS — Z862 Personal history of diseases of the blood and blood-forming organs and certain disorders involving the immune mechanism: Secondary | ICD-10-CM | POA: Diagnosis not present

## 2021-01-04 DIAGNOSIS — K5150 Left sided colitis without complications: Secondary | ICD-10-CM | POA: Diagnosis not present

## 2021-01-04 DIAGNOSIS — Z86010 Personal history of colonic polyps: Secondary | ICD-10-CM | POA: Diagnosis not present

## 2021-02-25 DIAGNOSIS — G894 Chronic pain syndrome: Secondary | ICD-10-CM | POA: Diagnosis not present

## 2021-02-25 DIAGNOSIS — J431 Panlobular emphysema: Secondary | ICD-10-CM | POA: Diagnosis not present

## 2021-02-25 DIAGNOSIS — E782 Mixed hyperlipidemia: Secondary | ICD-10-CM | POA: Diagnosis not present

## 2021-02-25 DIAGNOSIS — Z125 Encounter for screening for malignant neoplasm of prostate: Secondary | ICD-10-CM | POA: Diagnosis not present

## 2021-02-25 DIAGNOSIS — K5150 Left sided colitis without complications: Secondary | ICD-10-CM | POA: Diagnosis not present

## 2021-02-25 DIAGNOSIS — I10 Essential (primary) hypertension: Secondary | ICD-10-CM | POA: Diagnosis not present

## 2021-02-25 DIAGNOSIS — Z79899 Other long term (current) drug therapy: Secondary | ICD-10-CM | POA: Diagnosis not present

## 2021-02-25 DIAGNOSIS — E039 Hypothyroidism, unspecified: Secondary | ICD-10-CM | POA: Diagnosis not present

## 2021-02-25 DIAGNOSIS — Z23 Encounter for immunization: Secondary | ICD-10-CM | POA: Diagnosis not present

## 2021-02-25 DIAGNOSIS — F1021 Alcohol dependence, in remission: Secondary | ICD-10-CM | POA: Diagnosis not present

## 2021-02-25 DIAGNOSIS — D696 Thrombocytopenia, unspecified: Secondary | ICD-10-CM | POA: Diagnosis not present

## 2021-03-29 DIAGNOSIS — Z125 Encounter for screening for malignant neoplasm of prostate: Secondary | ICD-10-CM | POA: Diagnosis not present

## 2021-03-29 DIAGNOSIS — E782 Mixed hyperlipidemia: Secondary | ICD-10-CM | POA: Diagnosis not present

## 2021-03-29 DIAGNOSIS — Z79899 Other long term (current) drug therapy: Secondary | ICD-10-CM | POA: Diagnosis not present

## 2021-03-29 DIAGNOSIS — E039 Hypothyroidism, unspecified: Secondary | ICD-10-CM | POA: Diagnosis not present

## 2021-03-29 DIAGNOSIS — I10 Essential (primary) hypertension: Secondary | ICD-10-CM | POA: Diagnosis not present

## 2021-04-12 ENCOUNTER — Encounter: Payer: Self-pay | Admitting: Internal Medicine

## 2021-04-13 ENCOUNTER — Encounter
Admission: RE | Disposition: A | Discharge status: Home / Self Care | Payer: Self-pay | Source: Home / Self Care | Attending: Internal Medicine

## 2021-04-13 ENCOUNTER — Other Ambulatory Visit: Payer: Self-pay

## 2021-04-13 ENCOUNTER — Ambulatory Visit: Payer: PPO | Admitting: Anesthesiology

## 2021-04-13 ENCOUNTER — Ambulatory Visit
Admission: RE | Admit: 2021-04-13 | Discharge: 2021-04-13 | Disposition: A | Discharge status: Home / Self Care | Payer: PPO | Attending: Internal Medicine | Admitting: Internal Medicine

## 2021-04-13 ENCOUNTER — Encounter: Payer: Self-pay | Admitting: Internal Medicine

## 2021-04-13 DIAGNOSIS — K5150 Left sided colitis without complications: Secondary | ICD-10-CM | POA: Diagnosis not present

## 2021-04-13 DIAGNOSIS — Z1211 Encounter for screening for malignant neoplasm of colon: Secondary | ICD-10-CM | POA: Diagnosis not present

## 2021-04-13 DIAGNOSIS — K766 Portal hypertension: Secondary | ICD-10-CM | POA: Diagnosis not present

## 2021-04-13 DIAGNOSIS — K760 Fatty (change of) liver, not elsewhere classified: Secondary | ICD-10-CM | POA: Diagnosis not present

## 2021-04-13 DIAGNOSIS — D132 Benign neoplasm of duodenum: Secondary | ICD-10-CM | POA: Insufficient documentation

## 2021-04-13 DIAGNOSIS — M1990 Unspecified osteoarthritis, unspecified site: Secondary | ICD-10-CM | POA: Insufficient documentation

## 2021-04-13 DIAGNOSIS — Z8719 Personal history of other diseases of the digestive system: Secondary | ICD-10-CM | POA: Insufficient documentation

## 2021-04-13 DIAGNOSIS — K5730 Diverticulosis of large intestine without perforation or abscess without bleeding: Secondary | ICD-10-CM | POA: Insufficient documentation

## 2021-04-13 DIAGNOSIS — D696 Thrombocytopenia, unspecified: Secondary | ICD-10-CM | POA: Diagnosis not present

## 2021-04-13 DIAGNOSIS — J449 Chronic obstructive pulmonary disease, unspecified: Secondary | ICD-10-CM | POA: Diagnosis not present

## 2021-04-13 DIAGNOSIS — K640 First degree hemorrhoids: Secondary | ICD-10-CM | POA: Diagnosis not present

## 2021-04-13 DIAGNOSIS — Z86010 Personal history of colonic polyps: Secondary | ICD-10-CM | POA: Diagnosis not present

## 2021-04-13 DIAGNOSIS — Z8711 Personal history of peptic ulcer disease: Secondary | ICD-10-CM | POA: Diagnosis not present

## 2021-04-13 DIAGNOSIS — D649 Anemia, unspecified: Secondary | ICD-10-CM | POA: Diagnosis not present

## 2021-04-13 HISTORY — PX: COLONOSCOPY WITH PROPOFOL: SHX5780

## 2021-04-13 HISTORY — PX: ESOPHAGOGASTRODUODENOSCOPY: SHX5428

## 2021-04-13 SURGERY — COLONOSCOPY WITH PROPOFOL
Anesthesia: General

## 2021-04-13 MED ORDER — PROPOFOL 10 MG/ML IV BOLUS
INTRAVENOUS | Status: DC | PRN
  Administered 2021-04-13: 40 mg via INTRAVENOUS
  Administered 2021-04-13: 60 mg via INTRAVENOUS

## 2021-04-13 MED ORDER — PROPOFOL 500 MG/50ML IV EMUL
INTRAVENOUS | Status: AC
  Filled 2021-04-13: qty 50

## 2021-04-13 MED ORDER — PROPOFOL 500 MG/50ML IV EMUL
INTRAVENOUS | Status: DC | PRN
  Administered 2021-04-13: 175 ug/kg/min via INTRAVENOUS

## 2021-04-13 MED ORDER — PHENYLEPHRINE 40 MCG/ML (10ML) SYRINGE FOR IV PUSH (FOR BLOOD PRESSURE SUPPORT)
PREFILLED_SYRINGE | INTRAVENOUS | Status: DC | PRN
  Administered 2021-04-13: 80 ug via INTRAVENOUS

## 2021-04-13 MED ORDER — SODIUM CHLORIDE 0.9 % IV SOLN: INTRAVENOUS | Status: DC

## 2021-04-13 NOTE — Op Note (Signed)
[facility] Gastroenterology Patient Name: Clayton Robbins Procedure Date: 04/13/2021 10:12 AM MRN: [id number] Account #: [id number] Date of Birth: 08/19/1955 Admit Type: Outpatient Age: 65 Room: [facility] ENDO ROOM 2 Gender: Male Note Status: Finalized Instrument Name: Upper Endoscope [id number] Procedure:             Upper GI endoscopy Indications:           CIrrhosis of the liver, Portal venous hypertension Providers:             Benay Pike. Nikeshia Keetch MD, MD Medicines:             Propofol per Anesthesia Complications:         No immediate complications. Procedure:             Pre-Anesthesia Assessment:                        - The risks and benefits of the procedure and the                         sedation options and risks were discussed with the                         patient. All questions were answered and informed                         consent was obtained.                        - Patient identification and proposed procedure were                         verified prior to the procedure by the physician and                         the nurse. The procedure was verified in the procedure                         room.                        - ASA Grade Assessment: III - A patient with severe                         systemic disease.                        - After reviewing the risks and benefits, the patient                         was deemed in satisfactory condition to undergo the                         procedure.                        After obtaining informed consent, the endoscope was                         passed under direct vision. Throughout the procedure,  the patient's blood pressure, pulse, and oxygen                         saturations were monitored continuously. The Endoscope                         was introduced through the mouth, and advanced to the                         third part of duodenum. The upper GI endoscopy was                          accomplished without difficulty. The patient tolerated                         the procedure well. Findings:      The esophagus was normal.      The stomach was normal.      A single 10 mm sessile polyp with no bleeding was found in the second       portion of the duodenum. The polyp was removed with a cold snare.       Resection and retrieval were complete.      The first portion of the duodenum and third portion of the duodenum were       normal.      The exam was otherwise without abnormality.      There is no endoscopic evidence of varices in the entire esophagus.      There is no endoscopic evidence of varices in the entire examined       stomach. Impression:            - Normal esophagus.                        - Normal stomach.                        - A single duodenal polyp. Resected and retrieved.                        - Normal first portion of the duodenum and third                         portion of the duodenum.                        - The examination was otherwise normal. Recommendation:        - Await pathology results.                        - Repeat EGD for variceal screening/surveillance in 2                         years.                        - Proceed with colonoscopy Procedure Code(s):     --- Professional ---                        [phone], Esophagogastroduodenoscopy, flexible,  transoral; with removal of tumor(s), polyp(s), or                         other lesion(s) by snare technique Diagnosis Code(s):     --- Professional ---                        K31.7, Polyp of stomach and duodenum CPT copyright 2019 American Medical Association. All rights reserved. The codes documented in this report are preliminary and upon coder review may  be revised to meet current compliance requirements. Efrain Sella MD, MD 04/13/2021 10:43:23 AM This report has been signed electronically. Number of Addenda: 0 Note Initiated  On: 04/13/2021 10:12 AM Estimated Blood Loss:  Estimated blood loss: none.      [facility]

## 2021-04-13 NOTE — Anesthesia Postprocedure Evaluation (Signed)
Anesthesia Post Note  Patient: Clayton Robbins  Procedure(s) Performed: COLONOSCOPY WITH PROPOFOL ESOPHAGOGASTRODUODENOSCOPY (EGD)  Patient location during evaluation: Endoscopy Anesthesia Type: General Level of consciousness: awake and alert Pain management: pain level controlled Vital Signs Assessment: post-procedure vital signs reviewed and stable Respiratory status: spontaneous breathing, nonlabored ventilation, respiratory function stable and patient connected to nasal cannula oxygen Cardiovascular status: blood pressure returned to baseline and stable Postop Assessment: no apparent nausea or vomiting Anesthetic complications: no   No notable events documented.   Last Vitals:  Vitals:   04/13/21 1121 04/13/21 1131  BP: 129/78 138/84  Pulse:    Resp:    Temp:    SpO2:      Last Pain:  Vitals:   04/13/21 1131  TempSrc:   PainSc: 0-No pain                 Arita Miss

## 2021-04-13 NOTE — Transfer of Care (Signed)
Immediate Anesthesia Transfer of Care Note  Patient: Clayton Robbins  Procedure(s) Performed: COLONOSCOPY WITH PROPOFOL ESOPHAGOGASTRODUODENOSCOPY (EGD)  Patient Location: PACU  Anesthesia Type:General  Level of Consciousness: awake and alert   Airway & Oxygen Therapy: Patient Spontanous Breathing and Patient connected to nasal cannula oxygen  Post-op Assessment: Report given to RN and Post -op Vital signs reviewed and stable  Post vital signs: Reviewed and stable  Last Vitals:  Vitals Value Taken Time  BP 101/56 04/13/21 1101  Temp    Pulse 64 04/13/21 1101  Resp 17 04/13/21 1101  SpO2 95 % 04/13/21 1101  Vitals shown include unvalidated device data.  Last Pain:  Vitals:   04/13/21 0925  TempSrc: Temporal  PainSc: 0-No pain         Complications: No notable events documented.

## 2021-04-13 NOTE — Anesthesia Preprocedure Evaluation (Signed)
Anesthesia Evaluation  Patient identified by MRN, date of birth, ID band Patient awake    Reviewed: Allergy & Precautions, NPO status , Patient's Chart, lab work & pertinent test results  History of Anesthesia Complications Negative for: history of anesthetic complications  Airway Mallampati: II  TM Distance: >3 FB Neck ROM: Full    Dental  (+) Edentulous Upper, Edentulous Lower   Pulmonary neg sleep apnea, COPD,  COPD inhaler, Patient abstained from smoking.Not current smoker, former smoker,    breath sounds clear to auscultation- rhonchi (-) wheezing      Cardiovascular Exercise Tolerance: Good (-) hypertension(-) CAD and (-) Past MI  Rhythm:Regular Rate:Normal - Systolic murmurs and - Diastolic murmurs Normal echo 2018   Neuro/Psych negative neurological ROS  negative psych ROS   GI/Hepatic PUD, Fatty liver    Endo/Other  negative endocrine ROSneg diabetes  Renal/GU negative Renal ROS     Musculoskeletal  (+) Arthritis ,   Abdominal (+) - obese,   Peds  Hematology  (+) anemia ,   Anesthesia Other Findings Past Medical History: No date: Anemia No date: Asthma No date: Cirrhosis ([place]) No date: COPD (chronic obstructive pulmonary disease) (* No date: DDD (degenerative disc disease), lumbar No date: History of rhabdomyolysis No date: Hyperlipidemia No date: IBS (irritable bowel syndrome) No date: Thrombocytopenia ([place]) No date: Ulcerative colitis ([place])   Reproductive/Obstetrics                             Anesthesia Physical  Anesthesia Plan  ASA: 3  Anesthesia Plan: General   Post-op Pain Management: Minimal or no pain anticipated   Induction: Intravenous  PONV Risk Score and Plan: 2 and Propofol infusion, TIVA and Ondansetron  Airway Management Planned: Natural Airway  Additional Equipment: None  Intra-op Plan:   Post-operative Plan:   Informed Consent: I have  reviewed the patients History and Physical, chart, labs and discussed the procedure including the risks, benefits and alternatives for the proposed anesthesia with the patient or authorized representative who has indicated his/her understanding and acceptance.     Dental advisory given  Plan Discussed with: CRNA and Anesthesiologist  Anesthesia Plan Comments: (Discussed risks of anesthesia with patient, including possibility of difficulty with spontaneous ventilation under anesthesia necessitating airway intervention, PONV, and rare risks such as cardiac or respiratory or neurological events, and allergic reactions. Discussed the role of CRNA in patient's perioperative care. Patient understands.)        Anesthesia Quick Evaluation

## 2021-04-13 NOTE — Op Note (Signed)
[facility] Patient Name: Clayton Robbins Procedure Date: 04/13/2021 10:12 AM MRN: [id number] Account #: [id number] Date of Birth: 1955/05/21 Admit Type: Outpatient Age: 65 Room: [facility] ENDO ROOM 2 Gender: Male Note Status: Finalized Instrument Name: Jasper Riling [id number] Procedure:             Colonoscopy Indications:           High risk colon cancer surveillance: Ulcerative left                         sided colitis of 8 (or more) years duration Providers:             Benay Pike. Shira Bobst MD, MD Medicines:             Propofol per Anesthesia Complications:         No immediate complications. Procedure:             Pre-Anesthesia Assessment:                        - The risks and benefits of the procedure and the                         sedation options and risks were discussed with the                         patient. All questions were answered and informed                         consent was obtained.                        - Patient identification and proposed procedure were                         verified prior to the procedure by the nurse. The                         procedure was verified in the procedure room.                        - After reviewing the risks and benefits, the patient                         was deemed in satisfactory condition to undergo the                         procedure.                        - ASA Grade Assessment: III - A patient with severe                         systemic disease.                        After obtaining informed consent, the colonoscope was                         passed under direct vision. Throughout the procedure,  the patient's blood pressure, pulse, and oxygen                         saturations were monitored continuously. The                         Colonoscope was introduced through the anus and                         advanced to the the cecum, identified by  appendiceal                         orifice and ileocecal valve. The colonoscopy was                         performed without difficulty. The patient tolerated                         the procedure well. The quality of the bowel                         preparation was good. The ileocecal valve, appendiceal                         orifice, and rectum were photographed. Findings:      The perianal and digital rectal examinations were normal. Pertinent       negatives include normal sphincter tone and no palpable rectal lesions.      Non-bleeding internal hemorrhoids were found during retroflexion. The       hemorrhoids were Grade I (internal hemorrhoids that do not prolapse).      Many small-mouthed diverticula were found in the sigmoid colon. There       was no evidence of diverticular bleeding.      Normal mucosa was found in the entire colon. Four biopsies were obtained       with cold forceps for ulcerative colitis surveillance in the right       colon, as well as four biopsies in the transverse colon, four biopsies       in the left colon and two biopsies in the rectum.      No other significant abnormalities were identified in a careful       examination of the remainder of the colon. Impression:            - Non-bleeding internal hemorrhoids.                        - Mild diverticulosis in the sigmoid colon. There was                         no evidence of diverticular bleeding.                        - Normal mucosa in the entire examined colon.                        - Biopsies performed in the right colon, in the                         transverse colon,  in the left colon and in the rectum. Recommendation:        - Await pathology results from EGD, also performed                         today.                        - Patient has a contact number available for                         emergencies. The signs and symptoms of potential                         delayed complications  were discussed with the patient.                         Return to normal activities tomorrow. Written                         discharge instructions were provided to the patient.                        - Resume previous diet.                        - Continue present medications.                        - Await pathology results.                        - Repeat colonoscopy in 3 years for surveillance based                         on pathology results.                        - Return to my office in 1 year.                        - The findings and recommendations were discussed with                         the patient. Procedure Code(s):     --- Professional ---                        [id number], Colonoscopy, flexible; with biopsy, single or                         multiple Diagnosis Code(s):     --- Professional ---                        K57.30, Diverticulosis of large intestine without                         perforation or abscess without bleeding                        K64.0, First degree hemorrhoids  K51.50, Left sided colitis without complications CPT copyright 2019 [employer]. All rights reserved. The codes documented in this report are preliminary and upon coder review may  be revised to meet current compliance requirements. Efrain Sella MD, MD 04/13/2021 11:01:48 AM This report has been signed electronically. Number of Addenda: 0 Note Initiated On: 04/13/2021 10:12 AM Scope Withdrawal Time: 0 hours 10 minutes 58 seconds  Total Procedure Duration: 0 hours 13 minutes 20 seconds  Estimated Blood Loss:  Estimated blood loss: none.      [facility]

## 2021-04-13 NOTE — H&P (Signed)
Outpatient short stay form Pre-procedure 04/13/2021 10:13 AM Dontrel Smethers K. Alice Reichert, M.D.  Primary Physician: Fulton Reek, M.D.  Reason for visit:  Left sided ulcerative colitis  History of present illness:  Mr. Clayton Robbins is a pleasant 65 year old male presenting for new patient visit for the above problems. Patient has a reported history of left-sided ulcerative colitis diagnosed in November 2012 by Dr. Veverly Fells in [place]. He was placed on Lialda and has responded favorably with no significant flares in the last 10 years. He claims 1-2 bowel movements that are formed per day without blood or urgency. He denies any upper symptoms such as hematemesis, nausea or vomiting or increased abdominal girth. He has had a reported history of cirrhosis on ultrasound performed in 2015 when admitted for an acute vertebral fracture at [facility]. Subsequent ultrasounds on my review in 2017 revealed only fatty liver. Patient also has a history of thrombocytopenia which is consistent with cirrhosis but not diagnostic. Thrombocytopenia has improved with patient abstaining from alcohol.  Last endoscopy: 10/03/13 Rayann Heman) - normal; repeat in 2018 recommended Last colonoscopy: 03/16/11 (Dr. Benson Norway) - severe L-sided colitis, medium hemorrhoids, polyps (TA), diverticula (path: chronic active colitis); repeat in 2017 recommended. Last check in May 2022 revealed a normal platelet count of 244,000. CHEM 23 also revealed a normal albumin of 4.2 and serum creatinine 0.8. All electrolytes were also normal.  Patient has a history of severe COPD but does not take home oxygen. He does take regular inhaler therapy and has some mild dyspnea on exertion. He denies any chest pain, palpitations, orthopnea or significant peripheral edema.  Patient stopped drinking alcohol in July 2021 and has been abstinent since that time.  04/07/16 - Colonoscopy - RTE - Two TA polyps, diverticulosis.     Current  Facility-Administered Medications:    0.9 %  sodium chloride infusion, , Intravenous, Continuous, [place], Benay Pike, MD, Last Rate: 20 mL/hr at 04/13/21 0954, New Bag at 04/13/21 0954  Medications Prior to Admission  Medication Sig Dispense Refill Last Dose   albuterol (PROVENTIL HFA;VENTOLIN HFA) 108 (90 Base) MCG/ACT inhaler Inhale 2 puffs into the lungs every 4 (four) hours as needed for wheezing or shortness of breath.   Past Week   atorvastatin (LIPITOR) 10 MG tablet Take 10 mg by mouth daily.   04/12/2021   B Complex-C (B-COMPLEX WITH VITAMIN C) tablet Take 1 tablet by mouth daily.   Past Week   clonazePAM (KLONOPIN) 0.5 MG tablet Take 0.5 mg by mouth 2 (two) times daily as needed for anxiety.   Past Week   folic acid (FOLVITE) 1 MG tablet Take 1 mg by mouth daily.   04/12/2021   ibuprofen (ADVIL) 200 MG tablet Take 200 mg by mouth every 6 (six) hours as needed.   Past Week   levothyroxine (SYNTHROID) 50 MCG tablet Take 50 mcg by mouth daily before breakfast.   04/12/2021   losartan-hydrochlorothiazide (HYZAAR) 100-12.5 MG tablet Take 1 tablet by mouth daily.   04/12/2021   mesalamine (LIALDA) 1.2 G EC tablet Take 1,200 mg by mouth daily with breakfast.   04/12/2021   Multiple Vitamin (MULTIVITAMIN) tablet Take 1 tablet by mouth daily.   Past Week   propranolol (INDERAL) 10 MG tablet Take 10 mg by mouth daily.   04/12/2021   traZODone (DESYREL) 100 MG tablet Take 100 mg by mouth at bedtime.   Past Week   umeclidinium-vilanterol (ANORO ELLIPTA) 62.5-25 MCG/INH AEPB Inhale 1 puff into the lungs daily.  04/13/2021   HYDROcodone-acetaminophen (NORCO/VICODIN) 5-325 MG tablet Take 1 tablet by mouth every 6 (six) hours as needed for moderate pain.      mesalamine (CANASA) 1000 MG suppository Place 1,000 mg rectally at bedtime.      methocarbamol (ROBAXIN) 750 MG tablet Take 750 mg by mouth 4 (four) times daily.        No Known Allergies   Past Medical History:  Diagnosis Date   Anemia     Asthma    Cirrhosis (HCC)    COPD (chronic obstructive pulmonary disease) (HCC)    DDD (degenerative disc disease), lumbar    History of rhabdomyolysis    Hyperlipidemia    IBS (irritable bowel syndrome)    Thrombocytopenia ([facility])    Ulcerative colitis ([place])     Review of systems:  Otherwise negative.    Physical Exam  Gen: Alert, oriented. Appears stated age.  HEENT: [place]/AT. PERRLA. Lungs: CTA, no wheezes. CV: RR nl S1, S2. Abd: soft, benign, no masses. BS+ Ext: No edema. Pulses 2+    Planned procedures: Proceed with EGD and colonoscopy. The patient understands the nature of the planned procedure, indications, risks, alternatives and potential complications including but not limited to bleeding, infection, perforation, damage to internal organs and possible oversedation/side effects from anesthesia. The patient agrees and gives consent to proceed.  Please refer to procedure notes for findings, recommendations and patient disposition/instructions.     Byran Bilotti K. Alice Reichert, M.D. Gastroenterology 04/13/2021  10:13 AM

## 2021-04-14 ENCOUNTER — Encounter: Payer: Self-pay | Admitting: Internal Medicine

## 2021-04-14 LAB — SURGICAL PATHOLOGY

## 2021-04-28 DIAGNOSIS — R7989 Other specified abnormal findings of blood chemistry: Secondary | ICD-10-CM | POA: Diagnosis not present

## 2021-06-29 DIAGNOSIS — F1021 Alcohol dependence, in remission: Secondary | ICD-10-CM | POA: Diagnosis not present

## 2021-06-29 DIAGNOSIS — Z125 Encounter for screening for malignant neoplasm of prostate: Secondary | ICD-10-CM | POA: Diagnosis not present

## 2021-06-29 DIAGNOSIS — E782 Mixed hyperlipidemia: Secondary | ICD-10-CM | POA: Diagnosis not present

## 2021-06-29 DIAGNOSIS — I10 Essential (primary) hypertension: Secondary | ICD-10-CM | POA: Diagnosis not present

## 2021-06-29 DIAGNOSIS — D696 Thrombocytopenia, unspecified: Secondary | ICD-10-CM | POA: Diagnosis not present

## 2021-06-29 DIAGNOSIS — Z79899 Other long term (current) drug therapy: Secondary | ICD-10-CM | POA: Diagnosis not present

## 2021-06-29 DIAGNOSIS — J431 Panlobular emphysema: Secondary | ICD-10-CM | POA: Diagnosis not present

## 2021-06-29 DIAGNOSIS — K766 Portal hypertension: Secondary | ICD-10-CM | POA: Diagnosis not present

## 2021-06-29 DIAGNOSIS — Z0000 Encounter for general adult medical examination without abnormal findings: Secondary | ICD-10-CM | POA: Diagnosis not present

## 2021-06-29 DIAGNOSIS — E039 Hypothyroidism, unspecified: Secondary | ICD-10-CM | POA: Diagnosis not present

## 2021-06-29 DIAGNOSIS — G894 Chronic pain syndrome: Secondary | ICD-10-CM | POA: Diagnosis not present

## 2021-06-29 DIAGNOSIS — K7030 Alcoholic cirrhosis of liver without ascites: Secondary | ICD-10-CM | POA: Diagnosis not present

## 2021-07-18 DIAGNOSIS — J449 Chronic obstructive pulmonary disease, unspecified: Secondary | ICD-10-CM | POA: Diagnosis not present

## 2021-07-18 DIAGNOSIS — R0609 Other forms of dyspnea: Secondary | ICD-10-CM | POA: Diagnosis not present

## 2021-08-30 DIAGNOSIS — J449 Chronic obstructive pulmonary disease, unspecified: Secondary | ICD-10-CM | POA: Diagnosis not present

## 2021-09-28 DIAGNOSIS — Z125 Encounter for screening for malignant neoplasm of prostate: Secondary | ICD-10-CM | POA: Diagnosis not present

## 2021-09-28 DIAGNOSIS — E782 Mixed hyperlipidemia: Secondary | ICD-10-CM | POA: Diagnosis not present

## 2021-09-28 DIAGNOSIS — I10 Essential (primary) hypertension: Secondary | ICD-10-CM | POA: Diagnosis not present

## 2021-09-28 DIAGNOSIS — E039 Hypothyroidism, unspecified: Secondary | ICD-10-CM | POA: Diagnosis not present

## 2021-09-28 DIAGNOSIS — Z79899 Other long term (current) drug therapy: Secondary | ICD-10-CM | POA: Diagnosis not present

## 2021-09-29 DIAGNOSIS — D696 Thrombocytopenia, unspecified: Secondary | ICD-10-CM | POA: Diagnosis not present

## 2021-09-29 DIAGNOSIS — I10 Essential (primary) hypertension: Secondary | ICD-10-CM | POA: Diagnosis not present

## 2021-09-29 DIAGNOSIS — E039 Hypothyroidism, unspecified: Secondary | ICD-10-CM | POA: Diagnosis not present

## 2021-09-29 DIAGNOSIS — G894 Chronic pain syndrome: Secondary | ICD-10-CM | POA: Diagnosis not present

## 2021-09-29 DIAGNOSIS — Z79899 Other long term (current) drug therapy: Secondary | ICD-10-CM | POA: Diagnosis not present

## 2021-09-29 DIAGNOSIS — E782 Mixed hyperlipidemia: Secondary | ICD-10-CM | POA: Diagnosis not present

## 2021-09-29 DIAGNOSIS — F1021 Alcohol dependence, in remission: Secondary | ICD-10-CM | POA: Diagnosis not present

## 2021-09-29 DIAGNOSIS — J431 Panlobular emphysema: Secondary | ICD-10-CM | POA: Diagnosis not present

## 2021-09-30 DIAGNOSIS — J449 Chronic obstructive pulmonary disease, unspecified: Secondary | ICD-10-CM | POA: Diagnosis not present

## 2021-10-18 DIAGNOSIS — J449 Chronic obstructive pulmonary disease, unspecified: Secondary | ICD-10-CM | POA: Diagnosis not present

## 2021-10-18 DIAGNOSIS — I952 Hypotension due to drugs: Secondary | ICD-10-CM | POA: Diagnosis not present

## 2021-10-18 DIAGNOSIS — R0602 Shortness of breath: Secondary | ICD-10-CM | POA: Diagnosis not present

## 2021-10-30 DIAGNOSIS — J449 Chronic obstructive pulmonary disease, unspecified: Secondary | ICD-10-CM | POA: Diagnosis not present

## 2021-11-30 DIAGNOSIS — J449 Chronic obstructive pulmonary disease, unspecified: Secondary | ICD-10-CM | POA: Diagnosis not present

## 2021-12-31 DIAGNOSIS — J449 Chronic obstructive pulmonary disease, unspecified: Secondary | ICD-10-CM | POA: Diagnosis not present

## 2022-01-23 DIAGNOSIS — E039 Hypothyroidism, unspecified: Secondary | ICD-10-CM | POA: Diagnosis not present

## 2022-01-23 DIAGNOSIS — E782 Mixed hyperlipidemia: Secondary | ICD-10-CM | POA: Diagnosis not present

## 2022-01-23 DIAGNOSIS — Z79899 Other long term (current) drug therapy: Secondary | ICD-10-CM | POA: Diagnosis not present

## 2022-01-30 DIAGNOSIS — E039 Hypothyroidism, unspecified: Secondary | ICD-10-CM | POA: Diagnosis not present

## 2022-01-30 DIAGNOSIS — E782 Mixed hyperlipidemia: Secondary | ICD-10-CM | POA: Diagnosis not present

## 2022-01-30 DIAGNOSIS — G894 Chronic pain syndrome: Secondary | ICD-10-CM | POA: Diagnosis not present

## 2022-01-30 DIAGNOSIS — Z79899 Other long term (current) drug therapy: Secondary | ICD-10-CM | POA: Diagnosis not present

## 2022-01-30 DIAGNOSIS — I10 Essential (primary) hypertension: Secondary | ICD-10-CM | POA: Diagnosis not present

## 2022-01-30 DIAGNOSIS — J431 Panlobular emphysema: Secondary | ICD-10-CM | POA: Diagnosis not present

## 2022-01-30 DIAGNOSIS — J449 Chronic obstructive pulmonary disease, unspecified: Secondary | ICD-10-CM | POA: Diagnosis not present

## 2022-02-21 DIAGNOSIS — J449 Chronic obstructive pulmonary disease, unspecified: Secondary | ICD-10-CM | POA: Diagnosis not present

## 2022-02-21 DIAGNOSIS — R0609 Other forms of dyspnea: Secondary | ICD-10-CM | POA: Diagnosis not present

## 2022-03-02 DIAGNOSIS — J449 Chronic obstructive pulmonary disease, unspecified: Secondary | ICD-10-CM | POA: Diagnosis not present

## 2022-04-01 DIAGNOSIS — J449 Chronic obstructive pulmonary disease, unspecified: Secondary | ICD-10-CM | POA: Diagnosis not present

## 2022-05-02 DIAGNOSIS — J449 Chronic obstructive pulmonary disease, unspecified: Secondary | ICD-10-CM | POA: Diagnosis not present

## 2022-05-04 DIAGNOSIS — Z79899 Other long term (current) drug therapy: Secondary | ICD-10-CM | POA: Diagnosis not present

## 2022-05-04 DIAGNOSIS — E039 Hypothyroidism, unspecified: Secondary | ICD-10-CM | POA: Diagnosis not present

## 2022-05-04 DIAGNOSIS — E782 Mixed hyperlipidemia: Secondary | ICD-10-CM | POA: Diagnosis not present

## 2022-06-02 DIAGNOSIS — J449 Chronic obstructive pulmonary disease, unspecified: Secondary | ICD-10-CM | POA: Diagnosis not present

## 2022-06-21 DIAGNOSIS — Z87891 Personal history of nicotine dependence: Secondary | ICD-10-CM | POA: Diagnosis not present

## 2022-06-21 DIAGNOSIS — R0609 Other forms of dyspnea: Secondary | ICD-10-CM | POA: Diagnosis not present

## 2022-06-21 DIAGNOSIS — J449 Chronic obstructive pulmonary disease, unspecified: Secondary | ICD-10-CM | POA: Diagnosis not present

## 2022-07-01 DIAGNOSIS — J449 Chronic obstructive pulmonary disease, unspecified: Secondary | ICD-10-CM | POA: Diagnosis not present

## 2022-08-01 DIAGNOSIS — J449 Chronic obstructive pulmonary disease, unspecified: Secondary | ICD-10-CM | POA: Diagnosis not present

## 2022-08-30 DEATH — deceased
